# Patient Record
Sex: Male | Born: 2000 | Race: Black or African American | Hispanic: No | Marital: Single | Smoking: Never smoker
Health system: Southern US, Community
[De-identification: ages and names within clinical notes are randomized; demographics above are authoritative.]

## PROBLEM LIST (undated history)

## (undated) DIAGNOSIS — J45909 Unspecified asthma, uncomplicated: Secondary | ICD-10-CM

## (undated) HISTORY — PX: TESTICLE TORSION REDUCTION: SHX795

---
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
## 2021-07-12 ENCOUNTER — Other Ambulatory Visit: Payer: Self-pay

## 2021-07-12 ENCOUNTER — Ambulatory Visit (HOSPITAL_COMMUNITY)
Admission: EM | Admit: 2021-07-12 | Discharge: 2021-07-12 | Disposition: A | Discharge status: Home / Self Care | Payer: Medicaid Other | Attending: Physician Assistant | Admitting: Physician Assistant

## 2021-07-12 ENCOUNTER — Ambulatory Visit (INDEPENDENT_AMBULATORY_CARE_PROVIDER_SITE_OTHER): Payer: Medicaid Other

## 2021-07-12 ENCOUNTER — Encounter (HOSPITAL_COMMUNITY): Payer: Self-pay | Admitting: Emergency Medicine

## 2021-07-12 DIAGNOSIS — M25511 Pain in right shoulder: Secondary | ICD-10-CM

## 2021-07-12 MED ORDER — TIZANIDINE HCL 4 MG PO CAPS: 4.0000 mg | ORAL_CAPSULE | Freq: Three times a day (TID) | ORAL | 0 refills | Status: AC

## 2021-07-12 MED ORDER — PREDNISONE 20 MG PO TABS: 40.0000 mg | ORAL_TABLET | Freq: Every day | ORAL | 0 refills | Status: AC

## 2021-07-12 NOTE — ED Triage Notes (Signed)
Right shoulder pain for 2-3 days.  Denies a new injury.  Reports he has a history of having dislocated this shoulder before

## 2021-07-12 NOTE — Discharge Instructions (Signed)
Your x-ray was normal.  Please start prednisone to help with pain and inflammation.  Take Zanaflex up to 3 times a day for muscle relaxer.  This can make you sleepy so do not drive or drink alcohol with taking it.  Use heat, rest, stretch for additional symptom relief.  If symptoms are improving please follow-up with sports medicine provider as we discussed.  If any severe symptoms go to the emergency room.

## 2021-07-12 NOTE — ED Provider Notes (Signed)
[facility]    CSN: [id number] Arrival date & time: 07/12/21  1748      History   Chief Complaint Chief Complaint  Patient presents with   Shoulder Pain    HPI Benjamin Petty is a 20 y.o. male.   Patient presents today with a weeklong history of worsening right shoulder pain.  Reports several years ago he had a dislocation and has had intermittent pain since that time that has worsened significantly recently. He denies any known injury or increase in activity prior to symptom onset.  Reports pain is rated 9 on a 0-10 pain scale, localized to anterior right shoulder, described as aching periodic sharp pains, no aggravating or alleviating factors identified.  Denies previous surgery on right shoulder or additional injury besides dislocation.  He has not tried any over-the-counter medication.  He is right-handed.  He does report occasional numbness and tingling in fingers particularly when he sleeps in certain positions at night.  Denies any weakness.   Past Medical History:  Diagnosis Date   Asthma     Patient Active Problem List   Diagnosis Date Noted   Torsion of testicle 10/10/2015    Past Surgical History:  Procedure Laterality Date   ORCHIOPEXY Bilateral 10/10/2015   Procedure: ORCHIOPEXY ADULT;  Surgeon: Bjorn Pippin, MD;  Location: [facility];  Service: Urology;  Laterality: Bilateral;   TESTICLE TORSION REDUCTION     TONSILLECTOMY AND ADENOIDECTOMY Bilateral 08/19/2018   Procedure: TONSILLECTOMY AND ADENOIDECTOMY;  Surgeon: Newman Pies, MD;  Location: [facility];  Service: ENT;  Laterality: Bilateral;       Home Medications    Prior to Admission medications   Medication Sig Start Date End Date Taking? Authorizing Provider  predniSONE (DELTASONE) 20 MG tablet Take 2 tablets (40 mg total) by mouth daily. 07/12/21  Yes Gerron Guidotti K, PA-C  tiZANidine (ZANAFLEX) 4 MG capsule Take 1 capsule (4 mg total) by mouth 3 (three) times daily. 07/12/21  Yes  Rease Swinson, Noberto Retort, PA-C    Family History Family History  Problem Relation Age of Onset   Diabetes Mother     Social History Social History   Tobacco Use   Smoking status: Never   Smokeless tobacco: Never  Vaping Use   Vaping Use: Never used  Substance Use Topics   Alcohol use: No   Drug use: No     Allergies   Ibuprofen and Other   Review of Systems Review of Systems  Constitutional:  Positive for activity change. Negative for appetite change, fatigue and fever.  Respiratory:  Negative for cough and shortness of breath.   Cardiovascular:  Negative for chest pain.  Gastrointestinal:  Negative for abdominal pain, diarrhea, nausea and vomiting.  Musculoskeletal:  Positive for arthralgias. Negative for myalgias.  Neurological:  Negative for dizziness, weakness, light-headedness, numbness and headaches.    Physical Exam Triage Vital Signs ED Triage Vitals  Enc Vitals Group     BP 07/12/21 1850 121/68     Pulse Rate 07/12/21 1850 81     Resp 07/12/21 1850 16     Temp 07/12/21 1850 98.7 F (37.1 C)     Temp Source 07/12/21 1850 Oral     SpO2 07/12/21 1850 98 %     Weight --      Height --      Head Circumference --      Peak Flow --      Pain Score 07/12/21 1847 9  Pain Loc --      Pain Edu? --      Excl. in GC? --    No data found.  Updated Vital Signs BP 121/68 (BP Location: Left Arm)   Pulse 81   Temp 98.7 F (37.1 C) (Oral)   Resp 16   SpO2 98%   Visual Acuity Right Eye Distance:   Left Eye Distance:   Bilateral Distance:    Right Eye Near:   Left Eye Near:    Bilateral Near:     Physical Exam Vitals reviewed.  Constitutional:      General: He is awake.     Appearance: Normal appearance. He is well-developed. He is not ill-appearing.     Comments: Very pleasant male appears stated age in no acute distress sitting comfortably in exam room  HENT:     Head: Normocephalic and atraumatic.     Mouth/Throat:     Pharynx: No oropharyngeal  exudate, posterior oropharyngeal erythema or uvula swelling.  Cardiovascular:     Rate and Rhythm: Normal rate and regular rhythm.     Pulses:          Radial pulses are 2+ on the right side and 2+ on the left side.     Heart sounds: Normal heart sounds, S1 normal and S2 normal. No murmur heard. Pulmonary:     Effort: Pulmonary effort is normal.     Breath sounds: Normal breath sounds. No stridor. No wheezing, rhonchi or rales.     Comments: Clear to auscultation bilaterally Abdominal:     Palpations: Abdomen is soft.     Tenderness: There is no abdominal tenderness.  Musculoskeletal:     Right shoulder: Tenderness and bony tenderness present. No swelling. Decreased range of motion. Normal strength.     Comments: Right shoulder: Decreased range of motion with overhead flexion and internal and external rotation.  Tenderness palpation over AC joint.  Strength 5/5 bilateral upper extremities.  Negative drop arm and empty can.  Patient unable to perform movements for Apley scratch.  Neurological:     Mental Status: He is alert.  Psychiatric:        Behavior: Behavior is cooperative.     UC Treatments / Results  Labs (all labs ordered are listed, but only abnormal results are displayed) Labs Reviewed - No data to display  EKG   Radiology No results found.  Procedures Procedures (including critical care time)  Medications Ordered in UC Medications - No data to display  Initial Impression / Assessment and Plan / UC Course  I have reviewed the triage vital signs and the nursing notes.  Pertinent labs & imaging results that were available during my care of the patient were reviewed by me and considered in my medical decision making (see chart for details).     X-ray obtained given history of dislocation showed no acute osseous abnormality.  Patient is unable to take NSAIDs so we will start prednisone burst (40 mg x 4 days).  Prescribed Zanaflex to be used up to 3 times a day as  needed with instruction of drive drink alcohol with taking this medication.  Encouraged conservative treatment measures including heat, rest, stretch.  Discussed that if symptoms or not improving needs to follow-up with sports medicine was given contact information for local provider.  Discussed alarm symptoms that warrant emergent evaluation.  Strict return precautions given to which patient and mother expressed understanding.  Final Clinical Impressions(s) / UC Diagnoses   Final  diagnoses:  Acute pain of right shoulder     Discharge Instructions      Your x-ray was normal.  Please start prednisone to help with pain and inflammation.  Take Zanaflex up to 3 times a day for muscle relaxer.  This can make you sleepy so do not drive or drink alcohol with taking it.  Use heat, rest, stretch for additional symptom relief.  If symptoms are improving please follow-up with sports medicine provider as we discussed.  If any severe symptoms go to the emergency room.     ED Prescriptions     Medication Sig Dispense Auth. Provider   tiZANidine (ZANAFLEX) 4 MG capsule Take 1 capsule (4 mg total) by mouth 3 (three) times daily. 30 capsule Orianna Biskup K, PA-C   predniSONE (DELTASONE) 20 MG tablet Take 2 tablets (40 mg total) by mouth daily. 8 tablet Aarica Wax, Noberto Retort, PA-C      PDMP not reviewed this encounter.   Jeani Hawking, PA-C 07/12/21 2026

## 2021-12-15 IMAGING — DX DG SHOULDER 2+V*R*
3 series · 3 of 3 positions shown · non-contrast
Comparison: 10/22/2019

CLINICAL DATA: Pain and decreased range of motion

EXAM:
RIGHT SHOULDER - 2+ VIEW

[shoulder grashey]
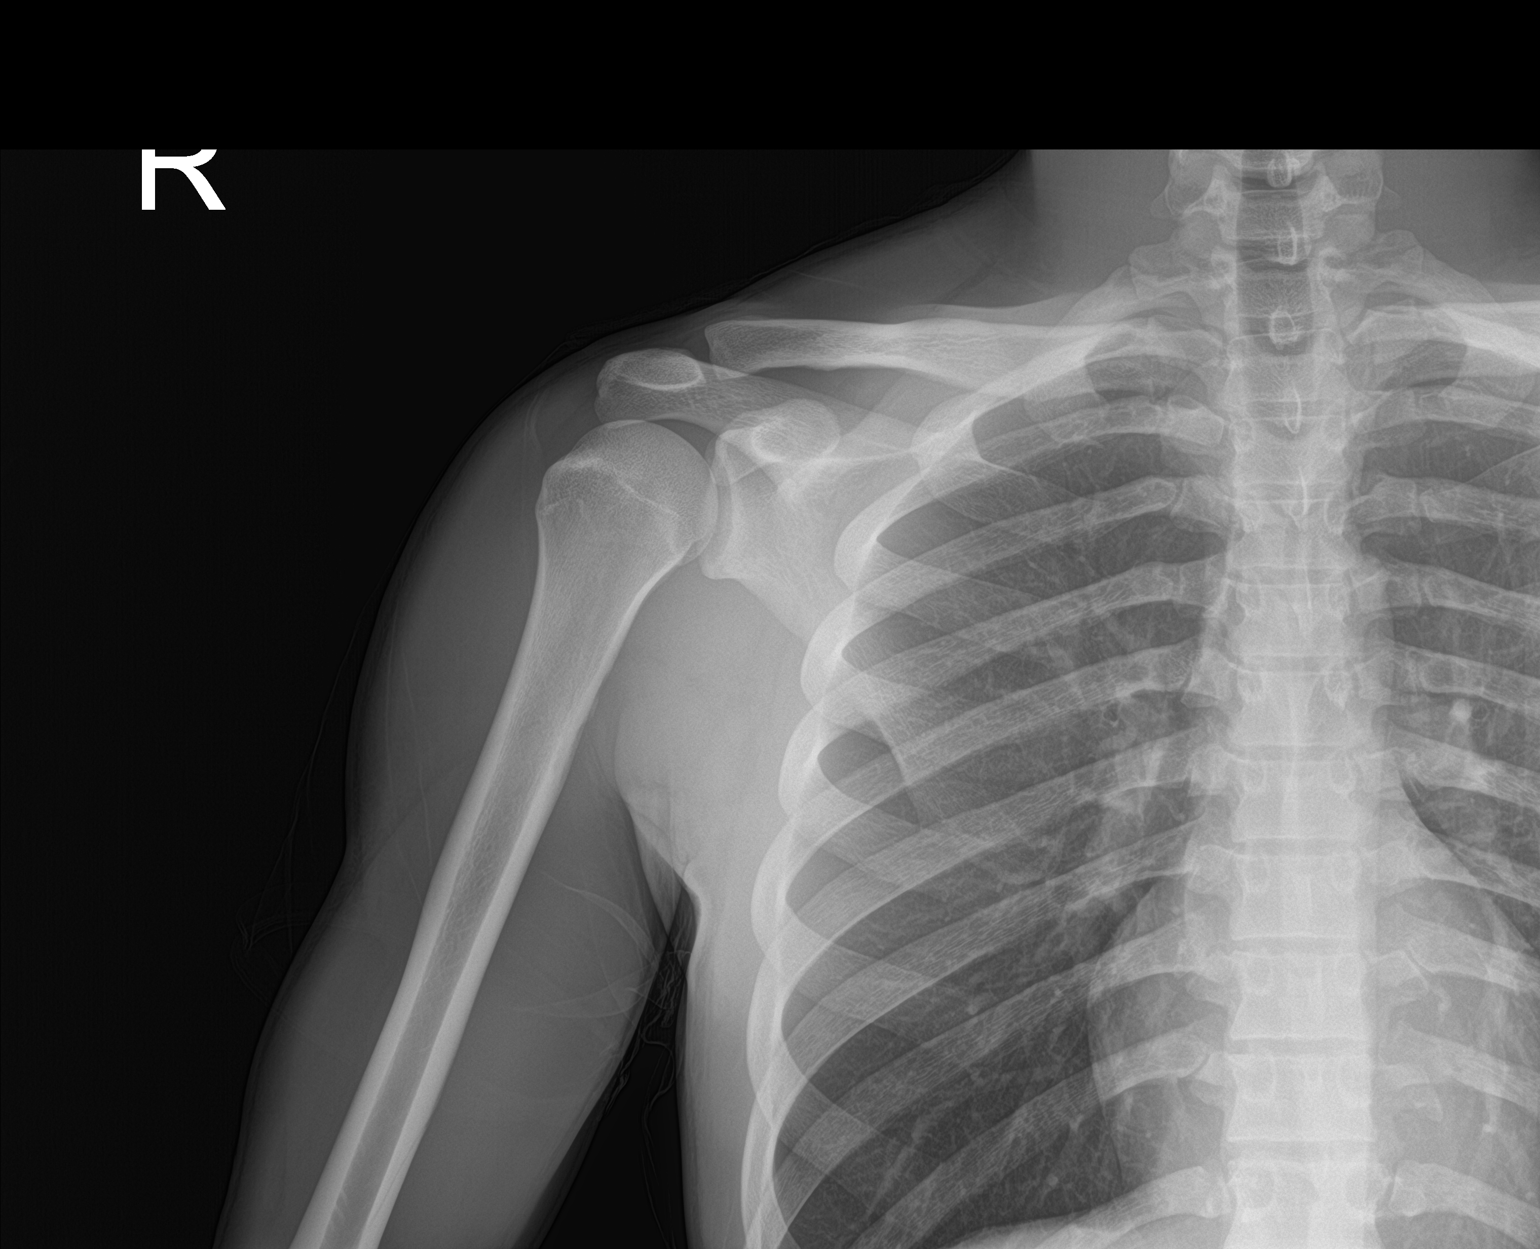

[shoulder y-view (1 of 2)]
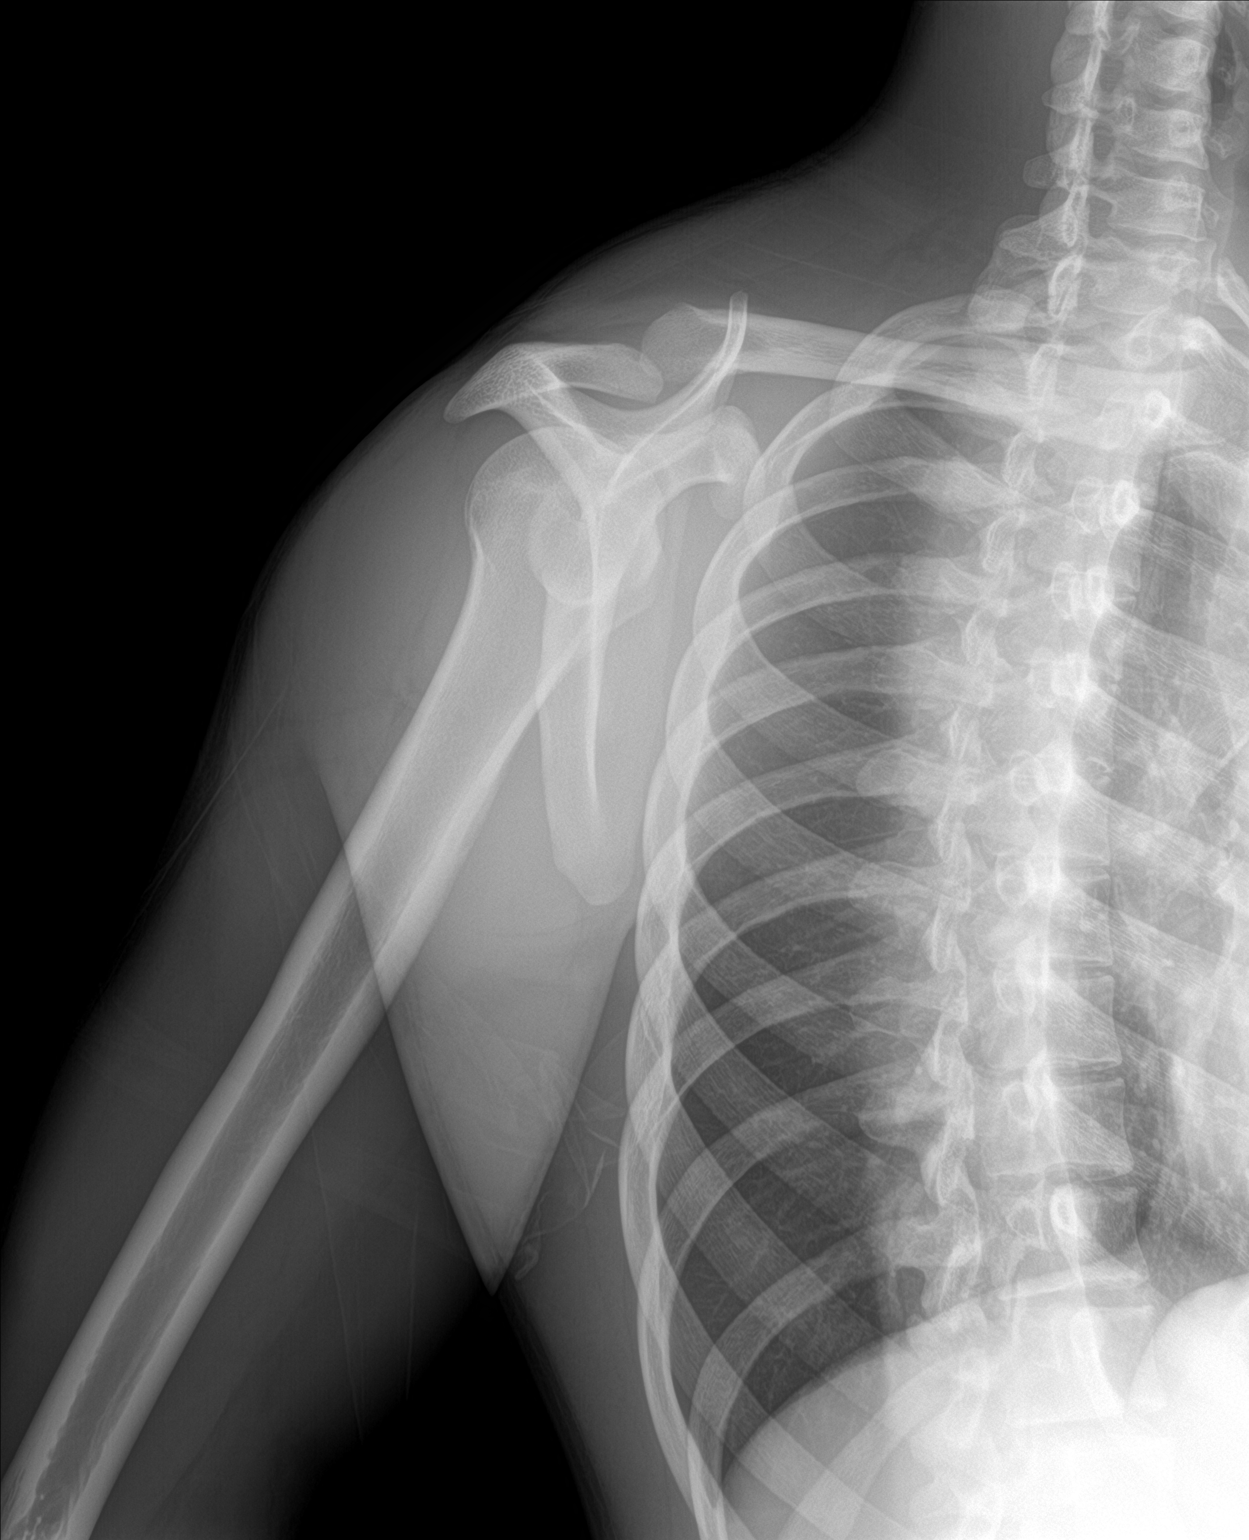

[shoulder y-view (2 of 2)]
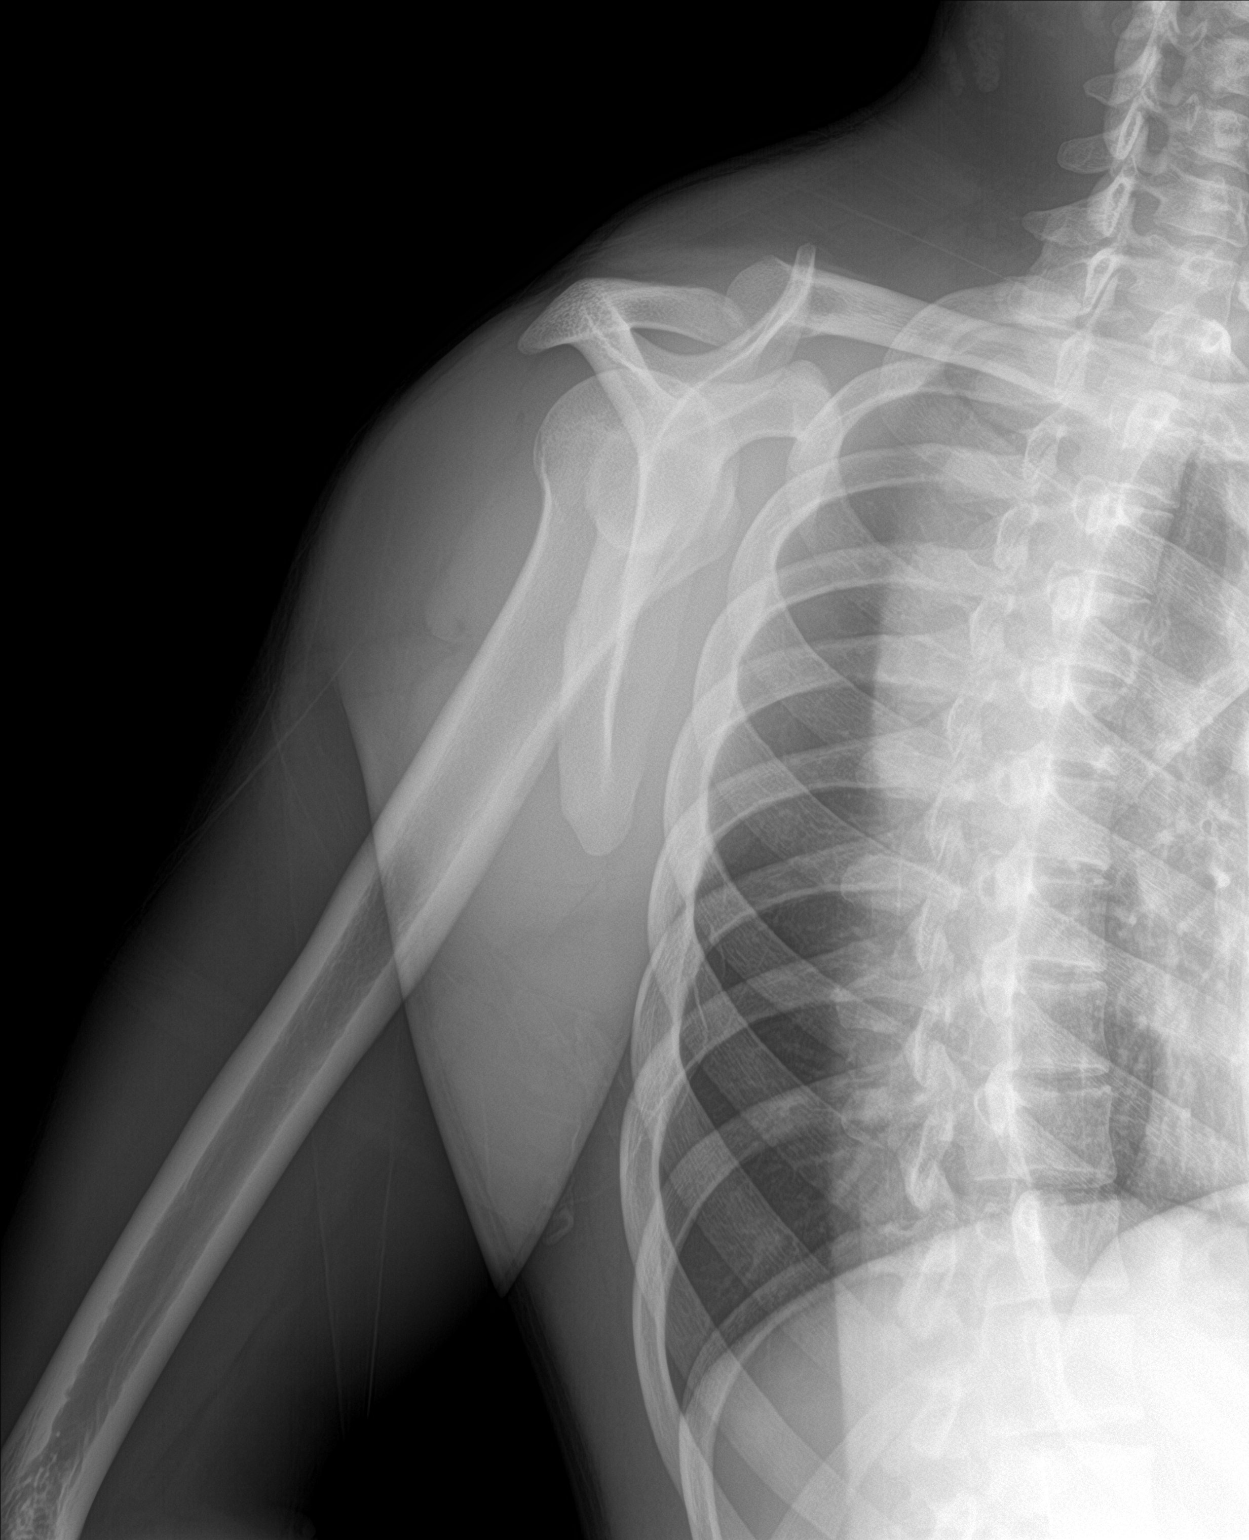

[3 of 3 positions shown; findings below may reference images not displayed]

FINDINGS: There is no evidence of fracture or dislocation. There is no
evidence of arthropathy or other focal bone abnormality. Soft
tissues are unremarkable.
IMPRESSION: Negative.

## 2023-04-22 ENCOUNTER — Emergency Department (HOSPITAL_COMMUNITY)
Admission: EM | Admit: 2023-04-22 | Discharge: 2023-04-22 | Disposition: A | Discharge status: Home / Self Care | Payer: Medicaid Other | Attending: Emergency Medicine | Admitting: Emergency Medicine

## 2023-04-22 ENCOUNTER — Other Ambulatory Visit: Payer: Self-pay

## 2023-04-22 ENCOUNTER — Encounter (HOSPITAL_COMMUNITY): Payer: Self-pay | Admitting: *Deleted

## 2023-04-22 DIAGNOSIS — U071 COVID-19: Secondary | ICD-10-CM | POA: Insufficient documentation

## 2023-04-22 DIAGNOSIS — H938X2 Other specified disorders of left ear: Secondary | ICD-10-CM | POA: Insufficient documentation

## 2023-04-22 LAB — RESP PANEL BY RT-PCR (RSV, FLU A&B, COVID)  RVPGX2
Influenza A by PCR: NEGATIVE
Influenza B by PCR: NEGATIVE
Resp Syncytial Virus by PCR: NEGATIVE
SARS Coronavirus 2 by RT PCR: POSITIVE — AB

## 2023-04-22 MED ORDER — ACETAMINOPHEN 500 MG PO TABS
1000.0000 mg | ORAL_TABLET | Freq: Once | ORAL | Status: AC
  Administered 2023-04-22: 1000 mg via ORAL
  Filled 2023-04-22: qty 2

## 2023-04-22 NOTE — Discharge Instructions (Signed)
Is a pleasure take care of you.  You are seen for headache and congestion for the past couple of days.  Your COVID test is positive.  Please take over-the-counter medications as needed for discomfort, drink plenty of fluids and rest, stay home for 5 days from the start of your symptoms if you are still having symptoms after that you to wear a mask for additional 5 days if you need to leave the house.  Wash your hands frequently, and avoid contact with other people to prevent transmission.  Mask if you need to be around others.

## 2023-04-22 NOTE — ED Provider Notes (Signed)
[place] EMERGENCY DEPARTMENT AT [facility] Provider Note   CSN: [id number] Arrival date & time: 04/22/23  1007     History  Chief Complaint  Patient presents with   Headache    Benjamin Petty is a 22 y.o. male.  Here to be evaluated in the ER today for complaint of 2-day history of sinus congestion, frontal headache, watery eyes and coughing up clear mucus.  No chest pain or shortness of breath, no fever or chills.  States he is very congested and having trouble breathing out of his nose.  His grandmother gave him some over-the-counter cold medicine yesterday which helped temporarily.  He is not taking either medications.  Denied any other sick contacts   Headache      Home Medications Prior to Admission medications   Medication Sig Start Date End Date Taking? Authorizing Provider  predniSONE (DELTASONE) 20 MG tablet Take 2 tablets (40 mg total) by mouth daily. 07/12/21   Raspet, Noberto Retort, PA-C  tiZANidine (ZANAFLEX) 4 MG capsule Take 1 capsule (4 mg total) by mouth 3 (three) times daily. 07/12/21   Raspet, Noberto Retort, PA-C      Allergies    Ibuprofen and Other    Review of Systems   Review of Systems  Neurological:  Positive for headaches.    Physical Exam Updated Vital Signs BP (!) 128/90 (BP Location: Right Arm)   Pulse 85   Temp 98.9 F (37.2 C) (Oral)   Resp 16   Ht 5\' 8"  (1.727 m)   Wt 64.9 kg   SpO2 100%   BMI 21.74 kg/m  Physical Exam Vitals and nursing note reviewed.  Constitutional:      General: He is not in acute distress.    Appearance: He is well-developed.  HENT:     Head: Normocephalic and atraumatic.     Right Ear: Tympanic membrane normal.     Left Ear: A middle ear effusion is present. Tympanic membrane is not erythematous.  Eyes:     Conjunctiva/sclera: Conjunctivae normal.  Cardiovascular:     Rate and Rhythm: Normal rate and regular rhythm.     Heart sounds: No murmur heard. Pulmonary:     Effort: Pulmonary effort is  normal. No respiratory distress.     Breath sounds: Normal breath sounds.  Abdominal:     Palpations: Abdomen is soft.     Tenderness: There is no abdominal tenderness.  Musculoskeletal:        General: No swelling.     Cervical back: Neck supple.  Skin:    General: Skin is warm and dry.     Capillary Refill: Capillary refill takes less than 2 seconds.  Neurological:     Mental Status: He is alert and oriented to person, place, and time.  Psychiatric:        Mood and Affect: Mood normal.     ED Results / Procedures / Treatments   Labs (all labs ordered are listed, but only abnormal results are displayed) Labs Reviewed  RESP PANEL BY RT-PCR (RSV, FLU A&B, COVID)  RVPGX2 - Abnormal; Notable for the following components:      Result Value   SARS Coronavirus 2 by RT PCR POSITIVE (*)    All other components within normal limits    EKG None  Radiology No results found.  Procedures Procedures    Medications Ordered in ED Medications  acetaminophen (TYLENOL) tablet 1,000 mg (1,000 mg Oral Given 04/22/23 1203)  ED Course/ Medical Decision Making/ A&P                                 Medical Decision Making DDx: Sinusitis, allergic rhinitis, COVID-19, migraine, tension headache, other  ED course: Here for 2 days of cough congestion frontal headache, no fevers, well-appearing well-hydrated with reassuring vitals.  Positive COVID-19 which is consistent with his symptoms.  Discussed symptomatic treatment, isolation, masking and return precautions.  He has no other medical conditions that put him at high risk.  No indication for antiviral treatment at this time.  Risk OTC drugs.           Final Clinical Impression(s) / ED Diagnoses Final diagnoses:  COVID-19    Rx / DC Orders ED Discharge Orders     None         Ma Rings, PA-C 04/22/23 1328    Derwood Kaplan, MD 04/23/23 1123

## 2023-04-22 NOTE — ED Notes (Signed)
ED Provider at bedside. 

## 2023-04-22 NOTE — ED Triage Notes (Signed)
Pt c/o headache and "hard time breathing", nasal congestion

## 2023-05-15 ENCOUNTER — Other Ambulatory Visit: Payer: Self-pay

## 2023-05-15 ENCOUNTER — Emergency Department (HOSPITAL_COMMUNITY)
Admission: EM | Admit: 2023-05-15 | Discharge: 2023-05-15 | Disposition: A | Discharge status: Home / Self Care | Payer: Self-pay | Attending: Emergency Medicine | Admitting: Emergency Medicine

## 2023-05-15 ENCOUNTER — Encounter (HOSPITAL_COMMUNITY): Payer: Self-pay | Admitting: Emergency Medicine

## 2023-05-15 DIAGNOSIS — M25512 Pain in left shoulder: Secondary | ICD-10-CM | POA: Insufficient documentation

## 2023-05-15 DIAGNOSIS — M542 Cervicalgia: Secondary | ICD-10-CM | POA: Insufficient documentation

## 2023-05-15 MED ORDER — ACETAMINOPHEN 325 MG PO TABS
650.0000 mg | ORAL_TABLET | Freq: Once | ORAL | Status: AC
  Administered 2023-05-15: 650 mg via ORAL
  Filled 2023-05-15: qty 2

## 2023-05-15 MED ORDER — METHOCARBAMOL 500 MG PO TABS: 500.0000 mg | ORAL_TABLET | Freq: Four times a day (QID) | ORAL | 0 refills | Status: AC | PRN

## 2023-05-15 MED ORDER — LIDOCAINE 5 % EX PTCH
1.0000 | MEDICATED_PATCH | CUTANEOUS | Status: DC
  Administered 2023-05-15: 1 via TRANSDERMAL
  Filled 2023-05-15: qty 1

## 2023-05-15 NOTE — ED Triage Notes (Signed)
Pt c/o left sided neck pain for the past few days that he noticed that morning when he woke up. Pt states he is concerned since he had hit his head the day before the pain started.

## 2023-05-15 NOTE — ED Provider Notes (Signed)
[name] Provider Note   CSN: [id number] Arrival date & time: 05/15/23  1846     History  Chief Complaint  Patient presents with   Neck Pain    Benjamin Petty is a 22 y.o. male.  Has a significant PMH.  Presents the ER today for a left neck pain.  He states 2 days ago he was sitting on the floor and sat up and hit his left side of his head off of a metal pole.  Denies LOC, denies headache.  He is not on blood thinners.  The next morning he woke up and had stiffness to the left trapezius area.  Has pain with rotation of the head to the right though he is able to do it.  Denies numbness tingling or weakness of any extremities.  Denies fevers or chills.  No other trauma.  No midline pain.  No nausea or vomiting.   Neck Pain      Home Medications Prior to Admission medications   Medication Sig Start Date End Date Taking? Authorizing Provider  methocarbamol (ROBAXIN) 500 MG tablet Take 1 tablet (500 mg total) by mouth every 6 (six) hours as needed for muscle spasms. 05/15/23  Yes Hani Campusano A, PA-C  predniSONE (DELTASONE) 20 MG tablet Take 2 tablets (40 mg total) by mouth daily. 07/12/21   Raspet, Noberto Retort, PA-C  tiZANidine (ZANAFLEX) 4 MG capsule Take 1 capsule (4 mg total) by mouth 3 (three) times daily. 07/12/21   Raspet, Noberto Retort, PA-C      Allergies    Ibuprofen and Other    Review of Systems   Review of Systems  Musculoskeletal:  Positive for neck pain.    Physical Exam Updated Vital Signs BP 128/74 (BP Location: Left Arm)   Pulse 70   Temp 98.9 F (37.2 C) (Oral)   Resp 15   Ht 5\' 8"  (1.727 m)   Wt 64.9 kg   SpO2 100%   BMI 21.74 kg/m  Physical Exam Vitals and nursing note reviewed.  Constitutional:      General: He is not in acute distress.    Appearance: He is well-developed.  HENT:     Head: Normocephalic and atraumatic.     Mouth/Throat:     Mouth: Mucous membranes are moist.  Eyes:      Conjunctiva/sclera: Conjunctivae normal.  Neck:     Comments: Patient can rotate his neck in both directions to 45 degrees.  No midline tenderness.  Can flex and extend without difficulty.  No adenopathy.  Tenderness noted to left trapezius area. Cardiovascular:     Rate and Rhythm: Normal rate and regular rhythm.     Heart sounds: No murmur heard. Pulmonary:     Effort: Pulmonary effort is normal. No respiratory distress.     Breath sounds: Normal breath sounds.  Abdominal:     Palpations: Abdomen is soft.     Tenderness: There is no abdominal tenderness.  Musculoskeletal:        General: No swelling.     Cervical back: Neck supple.  Skin:    General: Skin is warm and dry.     Capillary Refill: Capillary refill takes less than 2 seconds.  Neurological:     General: No focal deficit present.     Mental Status: He is alert and oriented to person, place, and time.  Psychiatric:        Mood and Affect: Mood normal.  ED Results / Procedures / Treatments   Labs (all labs ordered are listed, but only abnormal results are displayed) Labs Reviewed - No data to display  EKG None  Radiology No results found.  Procedures Procedures    Medications Ordered in ED Medications  acetaminophen (TYLENOL) tablet 650 mg (has no administration in time range)  lidocaine (LIDODERM) 5 % 1 patch (has no administration in time range)    ED Course/ Medical Decision Making/ A&P                                 Medical Decision Making DDx: Fracture, malalignment, strain, contusion, other ED course: Patient presents with left trapezius area pain that occurred after he had struck his head on a metal pole.  This was not high mechanism of injury, he simply sat up and struck it on the pole.  Has not had any headache.  Did not have any pain at the time but woke up the next morning with some neck pain.  [place] C-spine rules recommend no need for imaging.  Is a low risk injury, likely muscle  strain.  Getting some relief at home with ice and Biofreeze.  Will prescribe muscle relaxers.  He is allergic to ibuprofen so advised Tylenol.  Given work note at his request.  Has no numbness tingling or weakness.  Advised on follow-up and strict return precautions.  Risk OTC drugs. Prescription drug management.           Final Clinical Impression(s) / ED Diagnoses Final diagnoses:  Neck pain    Rx / DC Orders ED Discharge Orders          Ordered    methocarbamol (ROBAXIN) 500 MG tablet  Every 6 hours PRN        05/15/23 2229              Ma Rings, PA-C 05/15/23 2232    Sloan Leiter, DO 05/19/23 2352

## 2023-05-15 NOTE — Discharge Instructions (Signed)
Seen today for pain in the left side of your neck.  You likely have a muscle strain.  We gave you prescription for muscle relaxers.  Continue using ice and Tylenol as needed for discomfort.  You can also use over-the-counter lidocaine patches.  Follow-up with primary care.  Come back to the ER if you have any new or worsening symptoms.

## 2023-06-24 ENCOUNTER — Emergency Department (HOSPITAL_COMMUNITY)
Admission: EM | Admit: 2023-06-24 | Discharge: 2023-06-24 | Disposition: A | Discharge status: Home / Self Care | Payer: Self-pay | Attending: Emergency Medicine | Admitting: Emergency Medicine

## 2023-06-24 ENCOUNTER — Other Ambulatory Visit: Payer: Self-pay

## 2023-06-24 ENCOUNTER — Encounter (HOSPITAL_COMMUNITY): Payer: Self-pay

## 2023-06-24 DIAGNOSIS — T148XXA Other injury of unspecified body region, initial encounter: Secondary | ICD-10-CM

## 2023-06-24 DIAGNOSIS — M25512 Pain in left shoulder: Secondary | ICD-10-CM | POA: Insufficient documentation

## 2023-06-24 MED ORDER — METHOCARBAMOL 500 MG PO TABS: 500.0000 mg | ORAL_TABLET | Freq: Two times a day (BID) | ORAL | 0 refills | Status: AC

## 2023-06-24 MED ORDER — PREDNISONE 10 MG (21) PO TBPK: ORAL_TABLET | Freq: Every day | ORAL | 0 refills | Status: AC

## 2023-06-24 NOTE — ED Provider Notes (Signed)
[place] EMERGENCY DEPARTMENT AT [facility] Provider Note   CSN: [id number] Arrival date & time: 06/24/23  1050     History  Chief Complaint  Patient presents with   Shoulder Pain    Benjamin Petty is a 22 y.o. male.  22 year old male who presents with left lateral neck pain which is going on for about a month.  Denies any history of injury.  Patient states the pain is positional characterized as sharp.  States that he has not had any loss of function to his left hand.  Has used medicated patches and other medication without relief.         Home Medications Prior to Admission medications   Medication Sig Start Date End Date Taking? Authorizing Provider  methocarbamol (ROBAXIN) 500 MG tablet Take 1 tablet (500 mg total) by mouth every 6 (six) hours as needed for muscle spasms. 05/15/23   Carmel Sacramento A, PA-C  predniSONE (DELTASONE) 20 MG tablet Take 2 tablets (40 mg total) by mouth daily. 07/12/21   Raspet, Noberto Retort, PA-C  tiZANidine (ZANAFLEX) 4 MG capsule Take 1 capsule (4 mg total) by mouth 3 (three) times daily. 07/12/21   Raspet, Noberto Retort, PA-C      Allergies    Ibuprofen and Other    Review of Systems   Review of Systems  All other systems reviewed and are negative.   Physical Exam Updated Vital Signs BP 122/84 (BP Location: Left Arm)   Pulse 82   Temp 98.3 F (36.8 C) (Oral)   Resp 16   Ht 1.727 m (5\' 8" )   Wt 64.9 kg   SpO2 99%   BMI 21.76 kg/m  Physical Exam Vitals and nursing note reviewed.  Constitutional:      General: He is not in acute distress.    Appearance: Normal appearance. He is well-developed. He is not toxic-appearing.  HENT:     Head: Normocephalic and atraumatic.  Eyes:     General: Lids are normal.     Conjunctiva/sclera: Conjunctivae normal.     Pupils: Pupils are equal, round, and reactive to light.  Neck:     Thyroid: No thyroid mass.     Trachea: No tracheal deviation.  Cardiovascular:     Rate and Rhythm:  Normal rate and regular rhythm.     Heart sounds: Normal heart sounds. No murmur heard.    No gallop.  Pulmonary:     Effort: Pulmonary effort is normal. No respiratory distress.     Breath sounds: Normal breath sounds. No stridor. No decreased breath sounds, wheezing, rhonchi or rales.  Abdominal:     General: There is no distension.     Palpations: Abdomen is soft.     Tenderness: There is no abdominal tenderness. There is no rebound.  Musculoskeletal:        General: No tenderness. Normal range of motion.       Arms:     Cervical back: Normal range of motion and neck supple.     Comments: Neurovasc intact at left hand  Skin:    General: Skin is warm and dry.     Findings: No abrasion or rash.  Neurological:     Mental Status: He is alert and oriented to person, place, and time. Mental status is at baseline.     GCS: GCS eye subscore is 4. GCS verbal subscore is 5. GCS motor subscore is 6.     Cranial Nerves: Cranial nerves are intact.  No cranial nerve deficit.     Sensory: No sensory deficit.     Motor: Motor function is intact.  Psychiatric:        Attention and Perception: Attention normal.        Speech: Speech normal.        Behavior: Behavior normal.     ED Results / Procedures / Treatments   Labs (all labs ordered are listed, but only abnormal results are displayed) Labs Reviewed - No data to display  EKG None  Radiology No results found.  Procedures Procedures    Medications Ordered in ED Medications - No data to display  ED Course/ Medical Decision Making/ A&P                                 Medical Decision Making  Will start patient on course of prednisone.  No focal neurologic weakness at this time.  Suspect nerve impingement versus trigger point.  Will give referral to sports medicine        Final Clinical Impression(s) / ED Diagnoses Final diagnoses:  None    Rx / DC Orders ED Discharge Orders     None         Lorre Nick, MD 06/24/23 1155

## 2023-06-24 NOTE — ED Triage Notes (Signed)
Patient arrived reporting L neck pain, radiating down to shoulder x1 month . Seen by MD 1 month ago, got a prescriptions for muscle relaxer. Not much relief. Denies CP, dizziness or any other concerns.

## 2023-09-27 ENCOUNTER — Emergency Department (HOSPITAL_COMMUNITY)
Admission: EM | Admit: 2023-09-27 | Discharge: 2023-09-27 | Discharge status: Against Medical Advice | Payer: Self-pay | Source: Home / Self Care

## 2023-09-27 ENCOUNTER — Emergency Department (HOSPITAL_COMMUNITY)
Admission: EM | Admit: 2023-09-27 | Discharge: 2023-09-27 | Discharge status: Against Medical Advice | Payer: Self-pay | Attending: Emergency Medicine | Admitting: Emergency Medicine

## 2023-09-27 ENCOUNTER — Other Ambulatory Visit: Payer: Self-pay

## 2023-09-27 ENCOUNTER — Encounter (HOSPITAL_COMMUNITY): Payer: Self-pay | Admitting: *Deleted

## 2023-09-27 DIAGNOSIS — R059 Cough, unspecified: Secondary | ICD-10-CM | POA: Insufficient documentation

## 2023-09-27 DIAGNOSIS — Z5321 Procedure and treatment not carried out due to patient leaving prior to being seen by health care provider: Secondary | ICD-10-CM | POA: Insufficient documentation

## 2023-09-27 DIAGNOSIS — J029 Acute pharyngitis, unspecified: Secondary | ICD-10-CM | POA: Insufficient documentation

## 2023-09-27 DIAGNOSIS — R6883 Chills (without fever): Secondary | ICD-10-CM | POA: Insufficient documentation

## 2023-09-27 LAB — GROUP A STREP BY PCR: Group A Strep by PCR: NOT DETECTED

## 2023-09-27 NOTE — ED Triage Notes (Signed)
Pt with sore throat since Monday, getting worse on Wednesday.  + chills + cough at times

## 2024-05-25 ENCOUNTER — Encounter (HOSPITAL_COMMUNITY): Payer: Self-pay | Admitting: *Deleted

## 2024-05-25 ENCOUNTER — Emergency Department (HOSPITAL_COMMUNITY): Payer: Self-pay

## 2024-05-25 ENCOUNTER — Other Ambulatory Visit: Payer: Self-pay

## 2024-05-25 ENCOUNTER — Emergency Department (HOSPITAL_COMMUNITY)
Admission: EM | Admit: 2024-05-25 | Discharge: 2024-05-25 | Disposition: A | Discharge status: Home / Self Care | Payer: Self-pay | Attending: Emergency Medicine | Admitting: Emergency Medicine

## 2024-05-25 DIAGNOSIS — Y9361 Activity, american tackle football: Secondary | ICD-10-CM | POA: Insufficient documentation

## 2024-05-25 DIAGNOSIS — W500XXA Accidental hit or strike by another person, initial encounter: Secondary | ICD-10-CM | POA: Insufficient documentation

## 2024-05-25 DIAGNOSIS — S62636A Displaced fracture of distal phalanx of right little finger, initial encounter for closed fracture: Secondary | ICD-10-CM | POA: Insufficient documentation

## 2024-05-25 DIAGNOSIS — S62639A Displaced fracture of distal phalanx of unspecified finger, initial encounter for closed fracture: Secondary | ICD-10-CM

## 2024-05-25 DIAGNOSIS — M20011 Mallet finger of right finger(s): Secondary | ICD-10-CM | POA: Insufficient documentation

## 2024-05-25 MED ORDER — ACETAMINOPHEN 325 MG PO TABS
650.0000 mg | ORAL_TABLET | Freq: Once | ORAL | Status: AC
  Administered 2024-05-25: 650 mg via ORAL
  Filled 2024-05-25: qty 2

## 2024-05-25 NOTE — ED Triage Notes (Addendum)
 Here by POV from home for R 5th digit, injury, pain, deformity, decreased ROM. Injured playing football on Thursday. Rates 7/10. Unable to straighten. R hand dominant.

## 2024-05-25 NOTE — Discharge Instructions (Addendum)
 Please take Tylenol  1000 mg 3 times a day as needed for pain, ice and elevate the hand for couple of days, wear the finger splint to help keep your finger in a normal alignment, you will need to follow-up with the hand specialist to make sure that this does not need surgery.  Sometimes they do need surgery so I would call the office as soon as you are discharged or for [facility] tomorrow morning to make an appointment to be seen within the next week.

## 2024-05-25 NOTE — ED Provider Notes (Signed)
 [name] Provider Note   CSN: [id number] Arrival date & time: 05/25/24  1507     Patient presents with: Finger Injury   Benjamin Petty is a 23 y.o. male.   HPI   This patient is a 23 year old male who states that he was playing football 4 days ago when he struck the opposing players helmet with a fist as he was trying to push the player out of the way, he felt pain in his right small finger and has had pain and some swelling in that finger since.  He is right-hand dominant, he has not been evaluated for this injury prior to arrival  Prior to Admission medications   Medication Sig Start Date End Date Taking? Authorizing Provider  methocarbamol  (ROBAXIN ) 500 MG tablet Take 1 tablet (500 mg total) by mouth every 6 (six) hours as needed for muscle spasms. 05/15/23   Suellen Cantor A, PA-C  methocarbamol  (ROBAXIN ) 500 MG tablet Take 1 tablet (500 mg total) by mouth 2 (two) times daily. 06/24/23   Dasie Faden, MD  predniSONE  (DELTASONE ) 20 MG tablet Take 2 tablets (40 mg total) by mouth daily. 07/12/21   Raspet, Erin K, PA-C  predniSONE  (STERAPRED UNI-PAK 21 TAB) 10 MG (21) TBPK tablet Take by mouth daily. Take 6 tabs by mouth daily  for 2 days, then 5 tabs for 2 days, then 4 tabs for 2 days, then 3 tabs for 2 days, 2 tabs for 2 days, then 1 tab by mouth daily for 2 days 06/24/23   Dasie Faden, MD  tiZANidine  (ZANAFLEX ) 4 MG capsule Take 1 capsule (4 mg total) by mouth 3 (three) times daily. 07/12/21   Raspet, Erin K, PA-C    Allergies: Ibuprofen  and Other    Review of Systems  All other systems reviewed and are negative.   Updated Vital Signs BP 115/61 (BP Location: Left Arm)   Pulse 70   Temp 98.4 F (36.9 C) (Oral)   Resp 16   Ht 1.727 m (5' 8)   Wt 65.8 kg   SpO2 97%   BMI 22.05 kg/m   Physical Exam Vitals and nursing note reviewed.  Constitutional:      Appearance: He is well-developed. He is not diaphoretic.   HENT:     Head: Normocephalic and atraumatic.  Eyes:     General:        Right eye: No discharge.        Left eye: No discharge.     Conjunctiva/sclera: Conjunctivae normal.  Pulmonary:     Effort: Pulmonary effort is normal. No respiratory distress.  Musculoskeletal:     Comments: There is no open wounds over the right hand, he has normal pulses sensation and capillary refill distally to the entire hand.  Normal range of motion of the shoulder elbow and wrist on the right side.  He is able to flex and extend all of the fingers of the right hand except for the very small finger which she cannot fully extend, the extensor tendon to the right small finger does not appear to be completely intact.  He can extend at the MCP joint but not at the DIP joint and the finger lies in slight flexion.  Skin:    General: Skin is warm and dry.     Findings: No erythema or rash.  Neurological:     Mental Status: He is alert.     Coordination: Coordination normal.     (  all labs ordered are listed, but only abnormal results are displayed) Labs Reviewed - No data to display  EKG: None  Radiology: DG Finger Little Right Result Date: 05/25/2024 CLINICAL DATA:  trauma, cant extend fifth digit from injury during football game. EXAM: RIGHT LITTLE FINGER 2+V COMPARISON:  None Available. FINDINGS: Flexion deformity of the distal phalanx of the fifth digit.Small avulsion fracture along the dorsal aspect of the fifth DIP joint. No dislocation. There is no evidence of arthropathy or other focal bone abnormality. Soft tissue swelling about the distal fifth digit. No radiopaque foreign body. IMPRESSION: Small avulsion fracture along the dorsal aspect of the fifth DIP joint (mallet finger). Electronically Signed   By: Rogelia Myers M.D.   On: 05/25/2024 16:26   DG Hand Complete Right Result Date: 05/25/2024 CLINICAL DATA:  pain, injury, R 5th digit deformity EXAM: RIGHT HAND - COMPLETE 3+ VIEW COMPARISON:  None  Available. FINDINGS: Small avulsion fracture along the dorsal aspect of the fifth DIP joint. No dislocation. There is no evidence of arthropathy or other focal bone abnormality. Soft tissue swelling about the distal fifth digit. No radiopaque foreign body. IMPRESSION: Small avulsion fracture along the dorsal aspect of the fifth DIP joint (mallet finger). Electronically Signed   By: Rogelia Myers M.D.   On: 05/25/2024 16:25     Procedures   Medications Ordered in the ED  acetaminophen  (TYLENOL ) tablet 650 mg (650 mg Oral Given 05/25/24 1638)                                    Medical Decision Making Amount and/or Complexity of Data Reviewed Radiology: ordered.  Risk OTC drugs.   The patient has what appears to be an extensor tendon rupture of the right small finger, will image to make sure there is no broken bones, finger splint, follow-up with hand specialist  Radiology Imaging: I personally viewed the images of the ordered radiographic studies and find a small avulsion fracture with a mallet finger I agree with the radiologist interpretation as well  Splint placed by nursing, patient with NSAID allergy, referral to hand surgery outpatient  I have discussed with the patient at the bedside the results, and the meaning of these results.  They have had opportunity to ask questions,  expressed their understanding to the need for follow-up with primary care physician     Final diagnoses:  Mallet finger of right finger(s)  Closed avulsion fracture of distal phalanx of finger, initial encounter    ED Discharge Orders     None          Cleotilde Rogue, MD 05/25/24 [phone]
